# Patient Record
Sex: Female | Born: 1960 | Race: Black or African American | Hispanic: No | Marital: Married | Smoking: Never smoker
Health system: Southern US, Community
[De-identification: ages and names within clinical notes are randomized; demographics above are authoritative.]

## PROBLEM LIST (undated history)

## (undated) DIAGNOSIS — Z862 Personal history of diseases of the blood and blood-forming organs and certain disorders involving the immune mechanism: Secondary | ICD-10-CM

## (undated) DIAGNOSIS — E669 Obesity, unspecified: Secondary | ICD-10-CM

## (undated) DIAGNOSIS — G4730 Sleep apnea, unspecified: Secondary | ICD-10-CM

## (undated) HISTORY — DX: Obesity, unspecified: E66.9

## (undated) HISTORY — DX: Personal history of diseases of the blood and blood-forming organs and certain disorders involving the immune mechanism: Z86.2

## (undated) HISTORY — DX: Sleep apnea, unspecified: G47.30

---

## 1998-06-02 ENCOUNTER — Other Ambulatory Visit: Admission: RE | Admit: 1998-06-02 | Discharge: 1998-06-02 | Payer: Self-pay | Admitting: Obstetrics

## 1999-06-04 ENCOUNTER — Other Ambulatory Visit: Admission: RE | Admit: 1999-06-04 | Discharge: 1999-06-04 | Payer: Self-pay | Admitting: Obstetrics

## 1999-07-24 ENCOUNTER — Encounter: Admission: RE | Admit: 1999-07-24 | Discharge: 1999-07-24 | Payer: Self-pay | Admitting: Obstetrics

## 1999-07-24 ENCOUNTER — Encounter: Payer: Self-pay | Admitting: Obstetrics

## 2002-07-18 ENCOUNTER — Encounter: Payer: Self-pay | Admitting: Obstetrics

## 2002-07-18 ENCOUNTER — Ambulatory Visit (HOSPITAL_COMMUNITY): Admission: RE | Admit: 2002-07-18 | Discharge: 2002-07-18 | Payer: Self-pay | Admitting: Obstetrics

## 2003-10-02 ENCOUNTER — Ambulatory Visit (HOSPITAL_COMMUNITY): Admission: RE | Admit: 2003-10-02 | Discharge: 2003-10-02 | Payer: Self-pay | Admitting: Obstetrics

## 2005-11-16 ENCOUNTER — Ambulatory Visit (HOSPITAL_COMMUNITY): Admission: RE | Admit: 2005-11-16 | Discharge: 2005-11-16 | Payer: Self-pay | Admitting: Obstetrics

## 2007-02-17 ENCOUNTER — Other Ambulatory Visit: Admission: RE | Admit: 2007-02-17 | Discharge: 2007-02-17 | Payer: Self-pay | Admitting: Family Medicine

## 2007-02-23 ENCOUNTER — Encounter: Admission: RE | Admit: 2007-02-23 | Discharge: 2007-02-23 | Payer: Self-pay | Admitting: Family Medicine

## 2008-02-20 ENCOUNTER — Other Ambulatory Visit: Admission: RE | Admit: 2008-02-20 | Discharge: 2008-02-20 | Payer: Self-pay | Admitting: Physician Assistant

## 2008-02-23 ENCOUNTER — Encounter: Admission: RE | Admit: 2008-02-23 | Discharge: 2008-02-23 | Payer: Self-pay | Admitting: Family Medicine

## 2010-02-22 ENCOUNTER — Encounter: Payer: Self-pay | Admitting: Family Medicine

## 2010-03-27 ENCOUNTER — Other Ambulatory Visit: Payer: Self-pay | Admitting: Family Medicine

## 2010-03-27 DIAGNOSIS — Z1231 Encounter for screening mammogram for malignant neoplasm of breast: Secondary | ICD-10-CM

## 2010-04-07 ENCOUNTER — Ambulatory Visit: Payer: Self-pay

## 2010-04-07 ENCOUNTER — Ambulatory Visit
Admission: RE | Admit: 2010-04-07 | Discharge: 2010-04-07 | Disposition: A | Discharge status: Home / Self Care | Payer: BC Managed Care – PPO | Source: Ambulatory Visit | Attending: Family Medicine | Admitting: Family Medicine

## 2010-04-07 DIAGNOSIS — Z1231 Encounter for screening mammogram for malignant neoplasm of breast: Secondary | ICD-10-CM

## 2011-02-02 HISTORY — PX: OTHER SURGICAL HISTORY: SHX169

## 2011-03-08 ENCOUNTER — Other Ambulatory Visit: Payer: Self-pay | Admitting: Family Medicine

## 2011-03-08 DIAGNOSIS — Z1231 Encounter for screening mammogram for malignant neoplasm of breast: Secondary | ICD-10-CM

## 2011-04-12 ENCOUNTER — Ambulatory Visit
Admission: RE | Admit: 2011-04-12 | Discharge: 2011-04-12 | Disposition: A | Discharge status: Home / Self Care | Payer: BC Managed Care – PPO | Source: Ambulatory Visit | Attending: Family Medicine | Admitting: Family Medicine

## 2011-04-12 DIAGNOSIS — Z1231 Encounter for screening mammogram for malignant neoplasm of breast: Secondary | ICD-10-CM

## 2012-05-05 ENCOUNTER — Other Ambulatory Visit: Payer: Self-pay

## 2012-05-05 ENCOUNTER — Other Ambulatory Visit: Payer: Self-pay | Admitting: Family Medicine

## 2012-05-05 DIAGNOSIS — Z1231 Encounter for screening mammogram for malignant neoplasm of breast: Secondary | ICD-10-CM

## 2012-06-01 ENCOUNTER — Ambulatory Visit: Payer: BC Managed Care – PPO

## 2012-06-02 ENCOUNTER — Ambulatory Visit
Admission: RE | Admit: 2012-06-02 | Discharge: 2012-06-02 | Disposition: A | Discharge status: Home / Self Care | Payer: PRIVATE HEALTH INSURANCE | Source: Ambulatory Visit

## 2012-06-02 DIAGNOSIS — Z1231 Encounter for screening mammogram for malignant neoplasm of breast: Secondary | ICD-10-CM

## 2012-06-14 ENCOUNTER — Other Ambulatory Visit (HOSPITAL_COMMUNITY)
Admission: RE | Admit: 2012-06-14 | Discharge: 2012-06-14 | Disposition: A | Discharge status: Home / Self Care | Payer: PRIVATE HEALTH INSURANCE | Source: Ambulatory Visit | Attending: Family Medicine | Admitting: Family Medicine

## 2012-06-14 ENCOUNTER — Other Ambulatory Visit: Payer: Self-pay | Admitting: Family Medicine

## 2012-06-14 DIAGNOSIS — Z1151 Encounter for screening for human papillomavirus (HPV): Secondary | ICD-10-CM | POA: Insufficient documentation

## 2012-06-14 DIAGNOSIS — Z124 Encounter for screening for malignant neoplasm of cervix: Secondary | ICD-10-CM | POA: Insufficient documentation

## 2013-01-05 ENCOUNTER — Encounter: Payer: Self-pay | Admitting: General Surgery

## 2013-01-05 DIAGNOSIS — E669 Obesity, unspecified: Secondary | ICD-10-CM

## 2013-01-05 DIAGNOSIS — G4733 Obstructive sleep apnea (adult) (pediatric): Secondary | ICD-10-CM | POA: Insufficient documentation

## 2013-01-08 ENCOUNTER — Ambulatory Visit: Payer: PRIVATE HEALTH INSURANCE | Admitting: Cardiology

## 2013-02-20 ENCOUNTER — Emergency Department (HOSPITAL_COMMUNITY)
Admission: EM | Admit: 2013-02-20 | Discharge: 2013-02-20 | Disposition: A | Discharge status: Home / Self Care | Payer: PRIVATE HEALTH INSURANCE | Attending: Emergency Medicine | Admitting: Emergency Medicine

## 2013-02-20 ENCOUNTER — Emergency Department (HOSPITAL_COMMUNITY): Payer: PRIVATE HEALTH INSURANCE

## 2013-02-20 ENCOUNTER — Encounter (HOSPITAL_COMMUNITY): Payer: Self-pay | Admitting: Emergency Medicine

## 2013-02-20 DIAGNOSIS — IMO0002 Reserved for concepts with insufficient information to code with codable children: Secondary | ICD-10-CM | POA: Insufficient documentation

## 2013-02-20 DIAGNOSIS — Z862 Personal history of diseases of the blood and blood-forming organs and certain disorders involving the immune mechanism: Secondary | ICD-10-CM | POA: Insufficient documentation

## 2013-02-20 DIAGNOSIS — S0990XA Unspecified injury of head, initial encounter: Secondary | ICD-10-CM | POA: Insufficient documentation

## 2013-02-20 DIAGNOSIS — Y9389 Activity, other specified: Secondary | ICD-10-CM | POA: Insufficient documentation

## 2013-02-20 DIAGNOSIS — S0993XA Unspecified injury of face, initial encounter: Secondary | ICD-10-CM | POA: Insufficient documentation

## 2013-02-20 DIAGNOSIS — S8990XA Unspecified injury of unspecified lower leg, initial encounter: Secondary | ICD-10-CM | POA: Insufficient documentation

## 2013-02-20 DIAGNOSIS — S60221A Contusion of right hand, initial encounter: Secondary | ICD-10-CM

## 2013-02-20 DIAGNOSIS — G4730 Sleep apnea, unspecified: Secondary | ICD-10-CM | POA: Insufficient documentation

## 2013-02-20 DIAGNOSIS — Y92410 Unspecified street and highway as the place of occurrence of the external cause: Secondary | ICD-10-CM | POA: Insufficient documentation

## 2013-02-20 DIAGNOSIS — S99929A Unspecified injury of unspecified foot, initial encounter: Secondary | ICD-10-CM

## 2013-02-20 DIAGNOSIS — S0590XA Unspecified injury of unspecified eye and orbit, initial encounter: Secondary | ICD-10-CM | POA: Insufficient documentation

## 2013-02-20 DIAGNOSIS — E669 Obesity, unspecified: Secondary | ICD-10-CM | POA: Insufficient documentation

## 2013-02-20 DIAGNOSIS — S60229A Contusion of unspecified hand, initial encounter: Secondary | ICD-10-CM | POA: Insufficient documentation

## 2013-02-20 DIAGNOSIS — S99919A Unspecified injury of unspecified ankle, initial encounter: Secondary | ICD-10-CM | POA: Insufficient documentation

## 2013-02-20 DIAGNOSIS — S199XXA Unspecified injury of neck, initial encounter: Secondary | ICD-10-CM

## 2013-02-20 DIAGNOSIS — Z79899 Other long term (current) drug therapy: Secondary | ICD-10-CM | POA: Insufficient documentation

## 2013-02-20 MED ORDER — CYCLOBENZAPRINE HCL 10 MG PO TABS: 10.0000 mg | ORAL_TABLET | Freq: Three times a day (TID) | ORAL | Status: DC | PRN

## 2013-02-20 MED ORDER — TETRACAINE HCL 0.5 % OP SOLN
2.0000 [drp] | Freq: Once | OPHTHALMIC | Status: AC
  Administered 2013-02-20: 2 [drp] via OPHTHALMIC
  Filled 2013-02-20: qty 2

## 2013-02-20 MED ORDER — FLUORESCEIN SODIUM 1 MG OP STRP
1.0000 | ORAL_STRIP | Freq: Once | OPHTHALMIC | Status: AC
  Administered 2013-02-20: 1 via OPHTHALMIC
  Filled 2013-02-20: qty 1

## 2013-02-20 MED ORDER — IBUPROFEN 800 MG PO TABS: 800.0000 mg | ORAL_TABLET | Freq: Three times a day (TID) | ORAL | Status: DC | PRN
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
## 2013-02-21 NOTE — ED Provider Notes (Signed)
Medical screening examination/treatment/procedure(s) were performed by non-physician practitioner and as supervising physician I was immediately available for consultation/collaboration.  Megan E Docherty, MD 02/21/13 1200 

## 2013-02-23 ENCOUNTER — Ambulatory Visit
Admission: RE | Admit: 2013-02-23 | Discharge: 2013-02-23 | Disposition: A | Discharge status: Home / Self Care | Payer: PRIVATE HEALTH INSURANCE | Source: Ambulatory Visit | Attending: Family Medicine | Admitting: Family Medicine

## 2013-02-23 ENCOUNTER — Other Ambulatory Visit: Payer: Self-pay | Admitting: Family Medicine

## 2013-08-10 ENCOUNTER — Encounter: Payer: Self-pay | Admitting: *Deleted

## 2013-10-31 ENCOUNTER — Other Ambulatory Visit: Payer: Self-pay

## 2013-10-31 DIAGNOSIS — Z1231 Encounter for screening mammogram for malignant neoplasm of breast: Secondary | ICD-10-CM

## 2013-11-19 ENCOUNTER — Ambulatory Visit
Admission: RE | Admit: 2013-11-19 | Discharge: 2013-11-19 | Disposition: A | Discharge status: Home / Self Care | Payer: PRIVATE HEALTH INSURANCE | Source: Ambulatory Visit

## 2013-11-19 DIAGNOSIS — Z1231 Encounter for screening mammogram for malignant neoplasm of breast: Secondary | ICD-10-CM
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
## 2015-11-18 ENCOUNTER — Other Ambulatory Visit: Payer: Self-pay | Admitting: Family Medicine

## 2015-11-18 DIAGNOSIS — Z1231 Encounter for screening mammogram for malignant neoplasm of breast: Secondary | ICD-10-CM

## 2015-11-26 ENCOUNTER — Ambulatory Visit
Admission: RE | Admit: 2015-11-26 | Discharge: 2015-11-26 | Disposition: A | Discharge status: Home / Self Care | Payer: PRIVATE HEALTH INSURANCE | Source: Ambulatory Visit | Attending: Family Medicine | Admitting: Family Medicine

## 2015-11-26 DIAGNOSIS — Z1231 Encounter for screening mammogram for malignant neoplasm of breast: Secondary | ICD-10-CM

## 2016-08-19 ENCOUNTER — Other Ambulatory Visit (HOSPITAL_COMMUNITY)
Admission: RE | Admit: 2016-08-19 | Discharge: 2016-08-19 | Disposition: A | Discharge status: Home / Self Care | Payer: Self-pay | Source: Ambulatory Visit | Attending: Family Medicine | Admitting: Family Medicine

## 2016-08-19 ENCOUNTER — Other Ambulatory Visit: Payer: Self-pay | Admitting: Family Medicine

## 2016-08-19 DIAGNOSIS — Z124 Encounter for screening for malignant neoplasm of cervix: Secondary | ICD-10-CM | POA: Insufficient documentation

## 2016-08-23 LAB — CYTOLOGY - PAP
Diagnosis: NEGATIVE
HPV: NOT DETECTED

## 2017-09-28 DIAGNOSIS — Z1211 Encounter for screening for malignant neoplasm of colon: Secondary | ICD-10-CM | POA: Diagnosis not present

## 2017-09-28 DIAGNOSIS — G4733 Obstructive sleep apnea (adult) (pediatric): Secondary | ICD-10-CM | POA: Diagnosis not present

## 2017-09-28 DIAGNOSIS — Z0000 Encounter for general adult medical examination without abnormal findings: Secondary | ICD-10-CM | POA: Diagnosis not present

## 2017-09-28 DIAGNOSIS — R7303 Prediabetes: Secondary | ICD-10-CM | POA: Diagnosis not present

## 2017-11-07 ENCOUNTER — Encounter: Payer: Self-pay | Admitting: Registered"

## 2017-11-07 ENCOUNTER — Encounter: Payer: BLUE CROSS/BLUE SHIELD | Attending: Family Medicine | Admitting: Registered"

## 2017-11-07 DIAGNOSIS — Z713 Dietary counseling and surveillance: Secondary | ICD-10-CM | POA: Diagnosis not present

## 2017-11-07 DIAGNOSIS — R7303 Prediabetes: Secondary | ICD-10-CM | POA: Diagnosis not present

## 2017-11-07 NOTE — Progress Notes (Signed)
  Medical Nutrition Therapy:  Appt start time: 2:12 end time:  3:12.   Assessment:  Primary concerns today: Pt states she has prediabetes. Pt states she was told her A1c has increased since last year. Per referral, recent A1c is 6.3.   Pt expectations: wants to know what to eat, needs guidelines on what to eat to manage prediabetes and cholesterol  Pt states she wants to be <200 lbs by Aug 2020. Pt states she does not want to take medicine. Pt states parents did not have diabetes but her sister and niece have it. Pt states she is very confident and comfortable in her own skin.   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 Pt states she wants easy food options because she is busy. Pt states she averages about 7 hours of sleep a night; typically wakes up at 8pm, starts working at 10am, and eats breakfast most days before work.   Pt states she tries to do portion control; does not feel like she is restricting. Pt states her trainer thinks she is not eating enough.  Pt states she recently bought elliptical and exercise bike.     Preferred Learning Style:   No preference indicated   Learning Readiness:   Ready  Change in progress   MEDICATIONS: See list   DIETARY INTAKE:  Usual eating pattern includes 3 meals and 1 snacks per day.  Everyday foods include salad, grilled protein, vegetables, cereal, eggs, bacon, and greek yogurt.  Avoided foods include beets.    24-hr recall:  B ( AM): 1 c raisin bran or 2 slices of [place] bacon + scrambled eggs with cheese or blueberries + greek yogurt  Snk ( AM): none  L ( PM): salad (spinach, kale, cucumber, cheese, peppers) + chicken + vinaigrette dressing + crackers or grilled chicken Snk ( PM): none D ( PM): grilled salmon/shrimp/chicken or spaghetti + asparagus/spinach/green beans/broccoli or grilled chicken + popcorn Snk ( PM): sometimes popcorn or fruit or cheese Beverages: water, whole milk, almond milk, sweet tea  (occasionally), orange juice (2 glasses/week)  Usual physical activity: walking 2.2 miles 4-5 days/week + free weights + 100 wall pushups  Estimated energy needs: 1600 calories 180 g carbohydrates 120 g protein 44 g fat  Progress Towards Goal(s):  In progress.   Nutritional Diagnosis:  NB-1.1 Food and nutrition-related knowledge deficit As related to prediabetes.  As evidenced by pt reports she has not had previous education.    Intervention:  Nutrition education and counseling. Pt was educated and counseled on prediabetes, fiber, ways to increase fiber intake, work-life balance, My Plate, eating balanced meals/snacks, and eating to nourish her body. Pt was in agreement with goals listed.  Goals: - Add water aerobics to physical activity - Aim for at least 30 minutes of physical activity, 5 days/week - Aim to increase fiber intake with non-starchy vegetable intake with meals - Combine carbohydrates with proteins for snack options - Aim to eat breakfast within 2-3 hours of waking up  Teaching Method Utilized:  Visual Auditory Hands on  Handouts given during visit include:  My Plate for Diabetes  Barriers to learning/adherence to lifestyle change: none identified  Demonstrated degree of understanding via:  Teach Back   Monitoring/Evaluation:  Dietary intake, exercise, and body weight in 3 month(s).

## 2017-11-07 NOTE — Patient Instructions (Addendum)
-   Add water aerobics to physical activity  - Aim for at least 30 minutes of physical activity, 5 days/week  - Aim to increase fiber intake with non-starchy vegetable intake with meals  - Combine carbohydrates with proteins for snack options  - Aim to eat breakfast within 2-3 hours of waking up

## 2017-12-06 ENCOUNTER — Other Ambulatory Visit: Payer: Self-pay | Admitting: Family Medicine

## 2017-12-06 DIAGNOSIS — Z1231 Encounter for screening mammogram for malignant neoplasm of breast: Secondary | ICD-10-CM

## 2017-12-07 ENCOUNTER — Ambulatory Visit: Payer: BLUE CROSS/BLUE SHIELD

## 2018-01-17 ENCOUNTER — Ambulatory Visit
Admission: RE | Admit: 2018-01-17 | Discharge: 2018-01-17 | Disposition: A | Discharge status: Home / Self Care | Payer: BLUE CROSS/BLUE SHIELD | Source: Ambulatory Visit | Attending: Family Medicine | Admitting: Family Medicine

## 2018-01-17 DIAGNOSIS — Z1231 Encounter for screening mammogram for malignant neoplasm of breast: Secondary | ICD-10-CM | POA: Diagnosis not present

## 2018-02-07 ENCOUNTER — Ambulatory Visit: Payer: BLUE CROSS/BLUE SHIELD | Admitting: Registered"

## 2018-02-09 ENCOUNTER — Ambulatory Visit: Payer: BLUE CROSS/BLUE SHIELD | Admitting: Cardiology

## 2018-02-09 DIAGNOSIS — R7303 Prediabetes: Secondary | ICD-10-CM | POA: Diagnosis not present

## 2018-02-09 DIAGNOSIS — E7800 Pure hypercholesterolemia, unspecified: Secondary | ICD-10-CM | POA: Diagnosis not present

## 2018-02-20 ENCOUNTER — Ambulatory Visit: Payer: BLUE CROSS/BLUE SHIELD | Admitting: Cardiology

## 2018-02-24 ENCOUNTER — Ambulatory Visit: Payer: BLUE CROSS/BLUE SHIELD | Admitting: Cardiology

## 2018-02-24 ENCOUNTER — Telehealth: Payer: Self-pay | Admitting: *Deleted

## 2018-02-24 ENCOUNTER — Encounter (INDEPENDENT_AMBULATORY_CARE_PROVIDER_SITE_OTHER): Payer: Self-pay

## 2018-02-24 ENCOUNTER — Encounter: Payer: Self-pay | Admitting: Cardiology

## 2018-02-24 VITALS — BP 114/70 | HR 68 | Ht 65.0 in | Wt 243.0 lb

## 2018-02-24 DIAGNOSIS — G4733 Obstructive sleep apnea (adult) (pediatric): Secondary | ICD-10-CM | POA: Diagnosis not present

## 2018-02-24 NOTE — Telephone Encounter (Signed)
-----   Message from Dustin Flock, RN sent at 02/24/2018  3:04 PM EST ----- Regarding: Sleep Hello, Dr. Mayford Knife ordered the patient to get an appointment with [facility] and a download. She also needs to get new supplies. I did not order anything. Please order and send. Thanks, [employer]

## 2018-02-24 NOTE — Progress Notes (Signed)
Cardiology Office Note    Date:  02/24/2018   ID:  Linda Alexander, DOB 16-Dec-1960, MRN [id number]  PCP:  Sigmund Hazel, MD  Cardiologist:  Armanda Magic, MD   Chief Complaint  Patient presents with  . Sleep Apnea    History of Present Illness:  Linda Alexander is a 58 y.o. female who is being seen today for the evaluation of obstructive sleep apnea at the request of Sigmund Hazel, MD.  This is a 58 year old obese African-American female with a history of obesity, iron deficiency anemia, obstructive sleep apnea on CPAP.  She has been on CPAP for some time but has not followed up with a sleep doctor in years.  She is referred here for follow-up for sleep apnea in order for her to get more supplies from her DME.She is doing well with her CPAP device and thinks that she has gotten used to it.  She tolerates the mask and feels the pressure is adequate.  Since going on CPAP she feels rested in the am and has no significant daytime sleepiness.  Her only complaint is that she gets a dry mouth from time to time and does not know how to set her humidity dial on her device..  She does not think that she snores.       Past Medical History:  Diagnosis Date  . History of iron deficiency anemia   . LGA (large for gestational age) infant    Screen FBS yearly  . Obesity   . Sleep apnea    With CPAP -Dr Mayford Knife    Past Surgical History:  Procedure Laterality Date  . CESAREAN SECTION     x2  . colonoscopy  1/13   hem in 5 years, colon in 10 years    Current Medications: No outpatient medications have been marked as taking for the 02/24/18 encounter (Office Visit) with Quintella Reichert, MD.    Allergies:   Patient has no known allergies.   Social History   Socioeconomic History  . Marital status: Married    Spouse name: Not on file  . Number of children: Not on file  . Years of education: Not on file  . Highest education level: Not on file  Occupational History  . Not on  file  Social Needs  . Financial resource strain: Not on file  . Food insecurity:    Worry: Not on file    Inability: Not on file  . Transportation needs:    Medical: Not on file    Non-medical: Not on file  Tobacco Use  . Smoking status: Never Smoker  . Smokeless tobacco: Never Used  Substance and Sexual Activity  . Alcohol use: No  . Drug use: No  . Sexual activity: Not on file  Lifestyle  . Physical activity:    Days per week: Not on file    Minutes per session: Not on file  . Stress: Not on file  Relationships  . Social connections:    Talks on phone: Not on file    Gets together: Not on file    Attends religious service: Not on file    Active member of club or organization: Not on file    Attends meetings of clubs or organizations: Not on file    Relationship status: Not on file  Other Topics Concern  . Not on file  Social History Narrative  . Not on file     Family History:  The patient's family history includes  Anemia in her sister; Arthritis in her mother; Asthma in her sister; Congestive Heart Failure in her father and mother; Dementia in her mother; Diabetes in her maternal grandfather and sister; Diverticulitis in her father; Gout in her mother; Heart Problems in her mother; Hypertension in her brother, maternal grandmother, and mother; Kidney disease in her paternal grandfather; Thyroid disease in her mother and sister.   ROS:   Please see the history of present illness.    ROS All other systems reviewed and are negative.  No flowsheet data found.     PHYSICAL EXAM:   VS:  BP 114/70   Pulse 68   Ht 5\' 5"  (1.651 m)   Wt 243 lb (110.2 kg)   SpO2 98%   BMI 40.44 kg/m    GEN: Well nourished, well developed, in no acute distress  HEENT: normal  Neck: no JVD, carotid bruits, or masses Cardiac: RRR; no murmurs, rubs, or gallops,no edema.  Intact distal pulses bilaterally.  Respiratory:  clear to auscultation bilaterally, normal work of breathing GI:  soft, nontender, nondistended, + BS MS: no deformity or atrophy  Skin: warm and dry, no rash Neuro:  Alert and Oriented x 3, Strength and sensation are intact Psych: euthymic mood, full affect  Wt Readings from Last 3 Encounters:  02/24/18 243 lb (110.2 kg)  01/05/13 235 lb 9.6 oz (106.9 kg)      Studies/Labs Reviewed:   EKG:  EKG is not ordered today.    Recent Labs: No results found for requested labs within last 8760 hours.   Lipid Panel No results found for: CHOL, TRIG, HDL, CHOLHDL, VLDL, LDLCALC, LDLDIRECT  Additional studies/ records that were reviewed today include:  Office notes from [facility]    ASSESSMENT:    1. Obstructive sleep apnea (adult) (pediatric)   2. Morbid obesity (HCC)      PLAN:  In order of problems listed above:  1. OSA - she has a history of significant obstructive sleep apnea has been on CPAP for years.  She has not followed up with a sleep physician recently.  She is tolerating her CPAP and benefiting from it but she needs new supplies.  [facility].  I have also instructed her to take her device with her so that they can teach her how to use the humidity dial.  She will let me know if she develops any problems.  I will get a download from the DME.  2.  Morbid obesity - I have encouraged her to get into a routine exercise program and cut back on carbs and portions.     Medication Adjustments/Labs and Tests Ordered: Current medicines are reviewed at length with the patient today.  Concerns regarding medicines are outlined above.  Medication changes, Labs and Tests ordered today are listed in the Patient Instructions below.  There are no Patient Instructions on file for this visit.   Signed, Armanda Magic, MD  02/24/2018 2:44 PM    [facility] [address] [place], [place], [place]  [address] Phone: [phone]; Fax: [phone]

## 2018-02-24 NOTE — Patient Instructions (Signed)

## 2018-02-27 NOTE — Telephone Encounter (Signed)
Upon patient request DME selection is [facility]. Patient understands she will be contacted by [facility]. Patient understands to call if [facility] does not contact her in a timely manner. [facility] notified of new order  Please add to airview Patient was grateful for the call and thanked me.  I will get a download after patient gets her supplies.

## 2018-03-07 ENCOUNTER — Ambulatory Visit: Payer: BLUE CROSS/BLUE SHIELD | Admitting: Registered"

## 2018-03-16 ENCOUNTER — Encounter: Payer: Self-pay | Admitting: Registered"

## 2018-03-16 ENCOUNTER — Encounter: Payer: BLUE CROSS/BLUE SHIELD | Attending: Family Medicine | Admitting: Registered"

## 2018-03-16 DIAGNOSIS — R7303 Prediabetes: Secondary | ICD-10-CM | POA: Diagnosis not present

## 2018-03-16 NOTE — Progress Notes (Signed)
  Medical Nutrition Therapy:  Appt start time: 12:08 end time:  12:25.   Assessment:  Primary concerns today: Pt arrives today stating  A1c has decreased from 6.3 to 6.1, but cholesterol is 226. Pt states she has lost 9 lbs since previous visit.   Pt expectations: wants to know what to eat, needs guidelines on what to eat to manage prediabetes and cholesterol  Pt states she has been eating grilled shrimp about 3x/week. Has eliminated eating bread, reduced juice intake, reduced cheese intake all with efforts to maintain normal blood sugar and cholesterol values. Pt reports next doctor visit is May 2020.   Previous visit: Pt states she wants to be <200 lbs by Aug 2020. Pt states she does not want to take medicine. Pt states parents did not have diabetes but her sister and niece have it. Pt states she is very confident and comfortable in her own skin.   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 Pt states she wants easy food options because she is busy. Pt states she averages about 7 hours of sleep a night; typically wakes up at 8pm, starts working at 10am, and eats breakfast most days before work.   Pt states she tries to do portion control; does not feel like she is restricting. Pt states her trainer thinks she is not eating enough.    Preferred Learning Style:   No preference indicated   Learning Readiness:   Ready  Change in progress   MEDICATIONS: See list   DIETARY INTAKE:  Usual eating pattern includes 3 meals and 1 snacks per day.  Everyday foods include salad, grilled protein, vegetables, cereal, eggs, bacon, and greek yogurt.  Avoided foods include beets.    24-hr recall:  B ( AM): 1 c raisin bran or 2 slices of [place] bacon + scrambled eggs with cheese or berries + greek yogurt  Snk ( AM): none  L ( PM): salad (spinach, kale, cucumber, cheese, peppers) + chicken + vinaigrette dressing + crackers or grilled chicken Snk ( PM): none D ( PM):   asparagus/spinach/green beans/broccoli + grilled chicken  Snk ( PM): sometimes popcorn or fruit or cheese Beverages: water, whole milk, almond milk, sweet tea (occasionally), orange juice (2 glasses/week)  Usual physical activity: elliptical 20 min, stationary bike 4-5 days/week + free weights + 100 wall pushups  Estimated energy needs: 1600 calories 180 g carbohydrates 120 g protein 44 g fat  Progress Towards Goal(s):  In progress.   Nutritional Diagnosis:  NB-1.1 Food and nutrition-related knowledge deficit As related to prediabetes.  As evidenced by pt reports she has not had previous education.    Intervention:  Nutrition education and counseling. Pt was educated and counseled on ways to continue to work on reducing cholesterol intake. Pt was encouraged on great work with reducing A1c value.   Teaching Method Utilized:  Visual Auditory Hands on  Handouts given during visit include:  Cholesterol-lowering nutrition therapy  Barriers to learning/adherence to lifestyle change: none identified  Demonstrated degree of understanding via:  Teach Back   Monitoring/Evaluation:  Dietary intake, exercise, and body weight prn. Pt states she will contact RD after 06/2018 visit with PCP and update with lab values.

## 2018-04-05 ENCOUNTER — Telehealth: Payer: Self-pay | Admitting: *Deleted

## 2018-04-05 NOTE — Telephone Encounter (Signed)
-----   Message from Quintella Reichert, MD sent at 04/04/2018  3:07 PM EST ----- AHI too high - please verify what mask she uses and if she uses a chin strap.  Also document how old her mask is and if she sleeps on her back any

## 2018-04-05 NOTE — Telephone Encounter (Addendum)
Patient says she has a resmed Mirage Quattro medium size FF mask. Resmed airsense 10. She does not have a chin strap. Her mask is 70 months old. She does not sleep on her back but on her side.

## 2018-04-21 DIAGNOSIS — G4733 Obstructive sleep apnea (adult) (pediatric): Secondary | ICD-10-CM | POA: Diagnosis not present

## 2018-05-02 ENCOUNTER — Encounter

## 2018-05-02 ENCOUNTER — Ambulatory Visit: Payer: BLUE CROSS/BLUE SHIELD | Admitting: Cardiology

## 2018-05-29 DIAGNOSIS — G4733 Obstructive sleep apnea (adult) (pediatric): Secondary | ICD-10-CM | POA: Diagnosis not present

## 2018-06-05 ENCOUNTER — Telehealth: Payer: Self-pay | Admitting: *Deleted

## 2018-06-05 NOTE — Telephone Encounter (Signed)
Informed patient of compliance results and verbalized understanding was indicated. Patient is aware and agreeable to AHI being within range at 4.4. Patient is aware and agreeable to improving in compliance with machine usage.  

## 2018-06-05 NOTE — Telephone Encounter (Signed)
-----   Message from Quintella Reichert, MD sent at 06/02/2018  3:19 PM EDT ----- Good AHI on PAP but needs to improve compliance

## 2018-08-28 DIAGNOSIS — G4733 Obstructive sleep apnea (adult) (pediatric): Secondary | ICD-10-CM | POA: Diagnosis not present

## 2018-10-05 DIAGNOSIS — Z23 Encounter for immunization: Secondary | ICD-10-CM | POA: Diagnosis not present

## 2018-10-05 DIAGNOSIS — E7800 Pure hypercholesterolemia, unspecified: Secondary | ICD-10-CM | POA: Diagnosis not present

## 2018-10-05 DIAGNOSIS — Z1211 Encounter for screening for malignant neoplasm of colon: Secondary | ICD-10-CM | POA: Diagnosis not present

## 2018-10-05 DIAGNOSIS — Z0000 Encounter for general adult medical examination without abnormal findings: Secondary | ICD-10-CM | POA: Diagnosis not present

## 2018-10-05 DIAGNOSIS — R7303 Prediabetes: Secondary | ICD-10-CM | POA: Diagnosis not present

## 2018-11-29 DIAGNOSIS — G4733 Obstructive sleep apnea (adult) (pediatric): Secondary | ICD-10-CM | POA: Diagnosis not present

## 2018-12-07 ENCOUNTER — Telehealth: Payer: Self-pay | Admitting: Cardiology

## 2018-12-07 DIAGNOSIS — G4733 Obstructive sleep apnea (adult) (pediatric): Secondary | ICD-10-CM

## 2018-12-07 NOTE — Telephone Encounter (Signed)
What settings is she on

## 2018-12-07 NOTE — Telephone Encounter (Signed)
New Message:  The patient called and states that her CPAP machine has started to act up. She wanted to know if Dr. Radford Pax would put in an order for her to get a new CPAP machine. At the time of her sleep study, the machine the patient was using the patient paid for out of pocket. The patient has insurance coverage, and would like to use her insurance to get a new machine.  Please follow-up with patient

## 2018-12-11 NOTE — Telephone Encounter (Signed)
She is on a set pressure of 16 cm H20.

## 2018-12-11 NOTE — Telephone Encounter (Signed)
Please order ResMed CPAP at 16cm H2O with heated humidity and mask of choice

## 2018-12-11 NOTE — Telephone Encounter (Signed)
Order placed to [facility] for processing.

## 2018-12-21 ENCOUNTER — Other Ambulatory Visit: Payer: Self-pay

## 2018-12-21 DIAGNOSIS — Z20822 Contact with and (suspected) exposure to covid-19: Secondary | ICD-10-CM

## 2018-12-25 LAB — NOVEL CORONAVIRUS, NAA: SARS-CoV-2, NAA: NOT DETECTED

## 2018-12-26 ENCOUNTER — Telehealth: Payer: Self-pay

## 2018-12-26 NOTE — Telephone Encounter (Signed)
Patient given negative result and verbalized understanding  

## 2019-02-10 DIAGNOSIS — Z20822 Contact with and (suspected) exposure to covid-19: Secondary | ICD-10-CM | POA: Diagnosis not present

## 2019-02-10 DIAGNOSIS — Z9189 Other specified personal risk factors, not elsewhere classified: Secondary | ICD-10-CM | POA: Diagnosis not present

## 2019-02-21 DIAGNOSIS — Z20822 Contact with and (suspected) exposure to covid-19: Secondary | ICD-10-CM | POA: Diagnosis not present

## 2019-06-06 DIAGNOSIS — G4733 Obstructive sleep apnea (adult) (pediatric): Secondary | ICD-10-CM | POA: Diagnosis not present

## 2019-07-25 DIAGNOSIS — H3561 Retinal hemorrhage, right eye: Secondary | ICD-10-CM | POA: Diagnosis not present

## 2019-07-25 DIAGNOSIS — H3562 Retinal hemorrhage, left eye: Secondary | ICD-10-CM | POA: Diagnosis not present

## 2019-10-11 DIAGNOSIS — G4733 Obstructive sleep apnea (adult) (pediatric): Secondary | ICD-10-CM | POA: Diagnosis not present

## 2019-10-11 DIAGNOSIS — R7303 Prediabetes: Secondary | ICD-10-CM | POA: Diagnosis not present

## 2019-10-11 DIAGNOSIS — Z23 Encounter for immunization: Secondary | ICD-10-CM | POA: Diagnosis not present

## 2019-10-11 DIAGNOSIS — Z0000 Encounter for general adult medical examination without abnormal findings: Secondary | ICD-10-CM | POA: Diagnosis not present

## 2019-10-11 DIAGNOSIS — E7800 Pure hypercholesterolemia, unspecified: Secondary | ICD-10-CM | POA: Diagnosis not present

## 2019-10-11 DIAGNOSIS — R350 Frequency of micturition: Secondary | ICD-10-CM | POA: Diagnosis not present

## 2019-11-29 DIAGNOSIS — H3561 Retinal hemorrhage, right eye: Secondary | ICD-10-CM | POA: Diagnosis not present

## 2019-11-29 DIAGNOSIS — H35033 Hypertensive retinopathy, bilateral: Secondary | ICD-10-CM | POA: Diagnosis not present

## 2019-11-29 DIAGNOSIS — H3562 Retinal hemorrhage, left eye: Secondary | ICD-10-CM | POA: Diagnosis not present

## 2020-01-22 DIAGNOSIS — R059 Cough, unspecified: Secondary | ICD-10-CM | POA: Diagnosis not present

## 2020-02-06 ENCOUNTER — Other Ambulatory Visit: Payer: Self-pay | Admitting: Family Medicine

## 2020-02-06 DIAGNOSIS — Z1231 Encounter for screening mammogram for malignant neoplasm of breast: Secondary | ICD-10-CM

## 2020-03-17 ENCOUNTER — Ambulatory Visit
Admission: RE | Admit: 2020-03-17 | Discharge: 2020-03-17 | Disposition: A | Discharge status: Home / Self Care | Payer: BC Managed Care – PPO | Source: Ambulatory Visit | Attending: Family Medicine | Admitting: Family Medicine

## 2020-03-17 ENCOUNTER — Other Ambulatory Visit: Payer: Self-pay

## 2020-03-17 DIAGNOSIS — Z1231 Encounter for screening mammogram for malignant neoplasm of breast: Secondary | ICD-10-CM

## 2021-06-08 ENCOUNTER — Other Ambulatory Visit: Payer: Self-pay | Admitting: Family Medicine

## 2021-06-08 DIAGNOSIS — Z1231 Encounter for screening mammogram for malignant neoplasm of breast: Secondary | ICD-10-CM

## 2021-06-11 ENCOUNTER — Ambulatory Visit
Admission: RE | Admit: 2021-06-11 | Discharge: 2021-06-11 | Disposition: A | Discharge status: Home / Self Care | Payer: PRIVATE HEALTH INSURANCE | Source: Ambulatory Visit | Attending: Family Medicine | Admitting: Family Medicine

## 2021-06-11 DIAGNOSIS — Z1231 Encounter for screening mammogram for malignant neoplasm of breast: Secondary | ICD-10-CM

## 2021-06-12 ENCOUNTER — Ambulatory Visit: Payer: BC Managed Care – PPO

## 2022-05-11 ENCOUNTER — Other Ambulatory Visit: Payer: Self-pay | Admitting: Family Medicine

## 2022-05-11 DIAGNOSIS — Z1231 Encounter for screening mammogram for malignant neoplasm of breast: Secondary | ICD-10-CM

## 2022-06-17 ENCOUNTER — Inpatient Hospital Stay: Admission: RE | Admit: 2022-06-17 | Payer: PRIVATE HEALTH INSURANCE | Source: Ambulatory Visit

## 2022-07-08 ENCOUNTER — Ambulatory Visit
Admission: RE | Admit: 2022-07-08 | Discharge: 2022-07-08 | Disposition: A | Discharge status: Home / Self Care | Payer: PRIVATE HEALTH INSURANCE | Source: Ambulatory Visit | Attending: Family Medicine | Admitting: Family Medicine

## 2022-07-08 DIAGNOSIS — Z1231 Encounter for screening mammogram for malignant neoplasm of breast: Secondary | ICD-10-CM

## 2023-06-23 ENCOUNTER — Other Ambulatory Visit: Payer: Self-pay | Admitting: Family Medicine

## 2023-06-23 DIAGNOSIS — Z1231 Encounter for screening mammogram for malignant neoplasm of breast: Secondary | ICD-10-CM

## 2023-07-06 IMAGING — MG MM DIGITAL SCREENING BILAT W/ TOMO AND CAD
6 of 10 series · 6 of 30 positions shown · non-contrast
Comparison: Previous exam(s).

CLINICAL DATA: Screening.

EXAM:
DIGITAL SCREENING BILATERAL MAMMOGRAM WITH TOMOSYNTHESIS AND CAD
TECHNIQUE: Bilateral screening digital craniocaudal and mediolateral oblique
mammograms were obtained. Bilateral screening digital breast
tomosynthesis was performed. The images were evaluated with
computer-aided detection.

[L CC synth-2D]
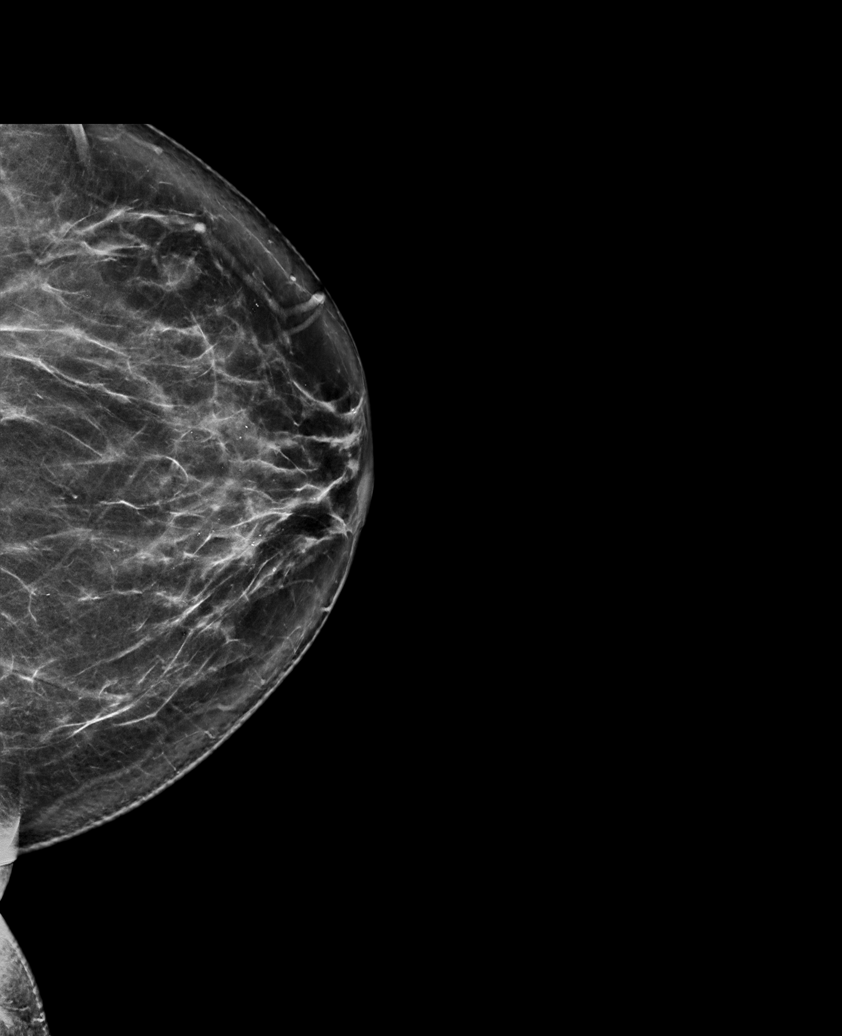

[R CC synth-2D]
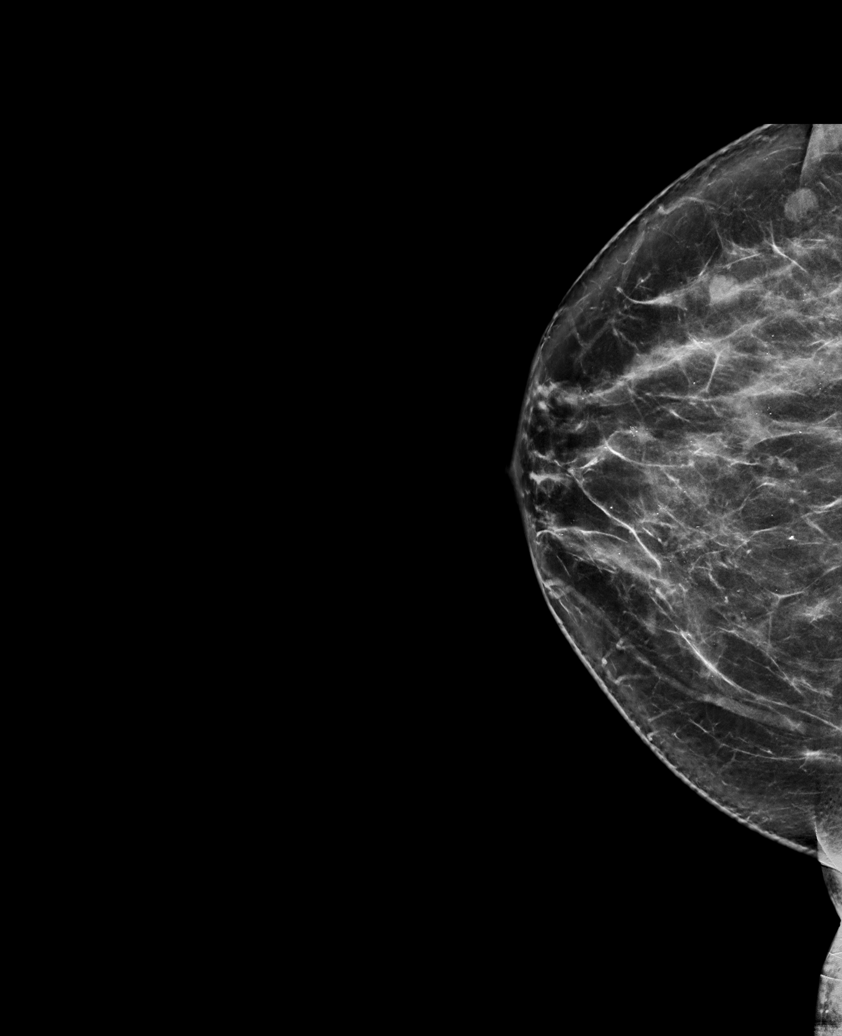

[L MLO synth-2D]
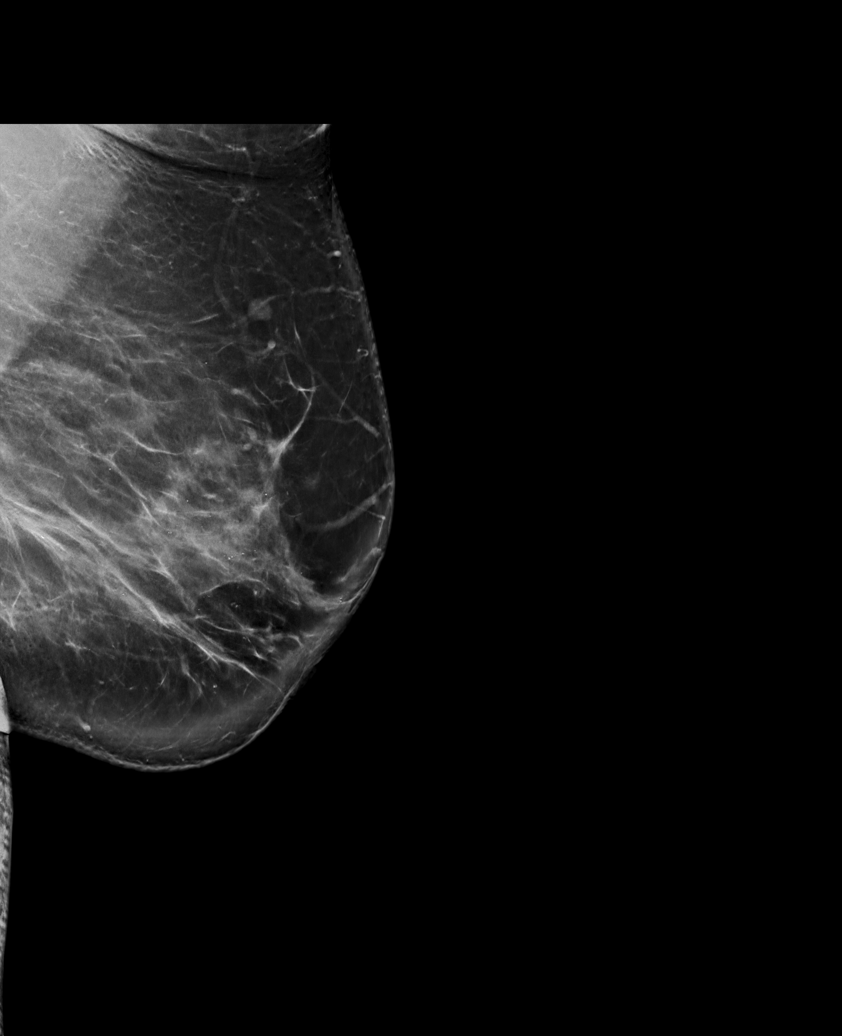

[R MLO synth-2D (1 of 2)]
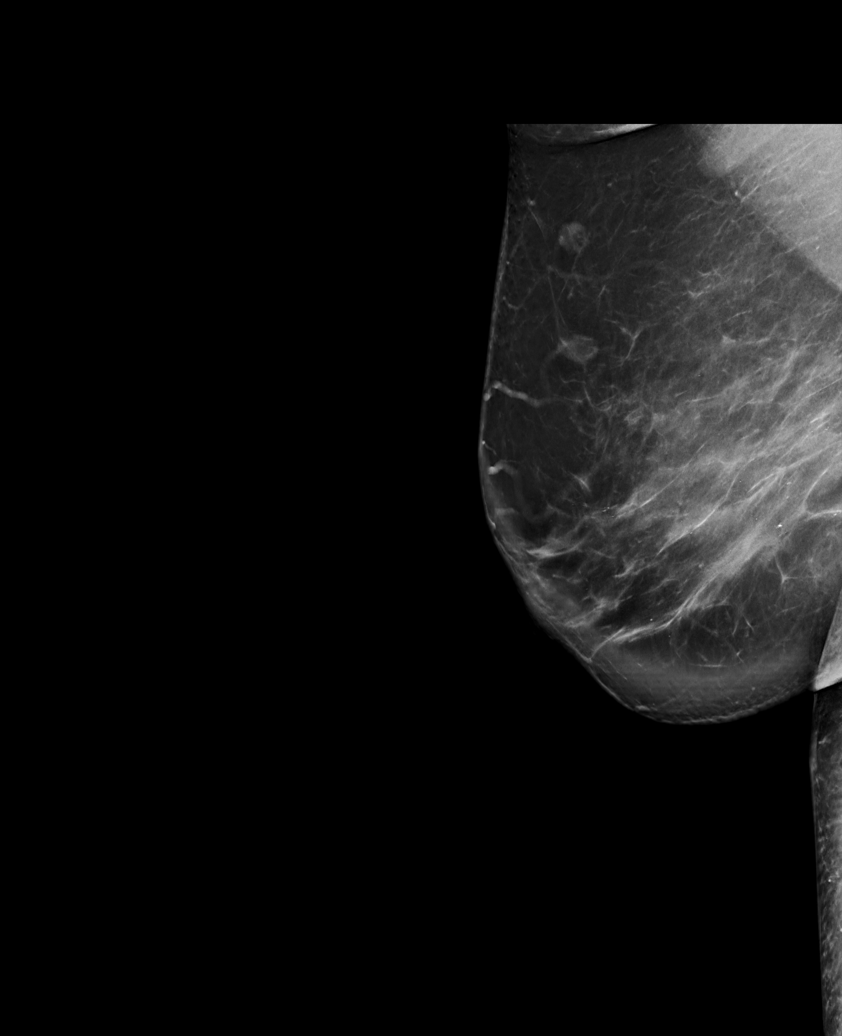

[R MLO synth-2D (2 of 2)]
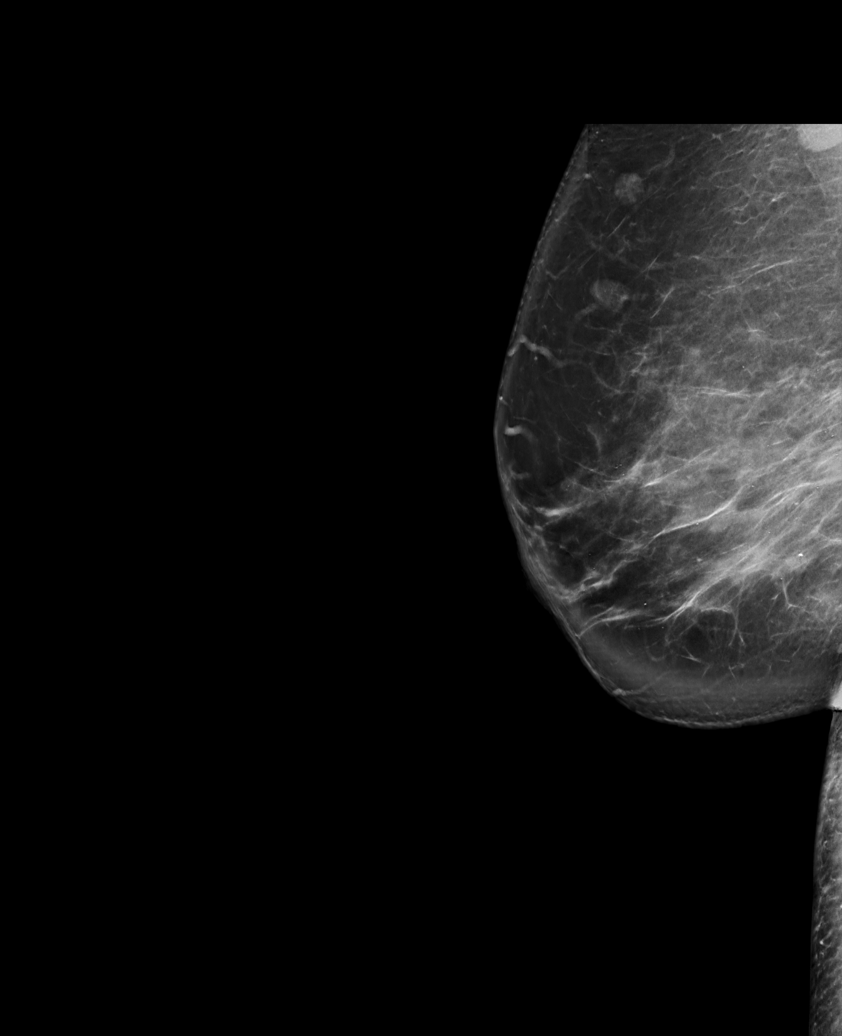

[R MLO tomo · tomo slice 61/120.0]
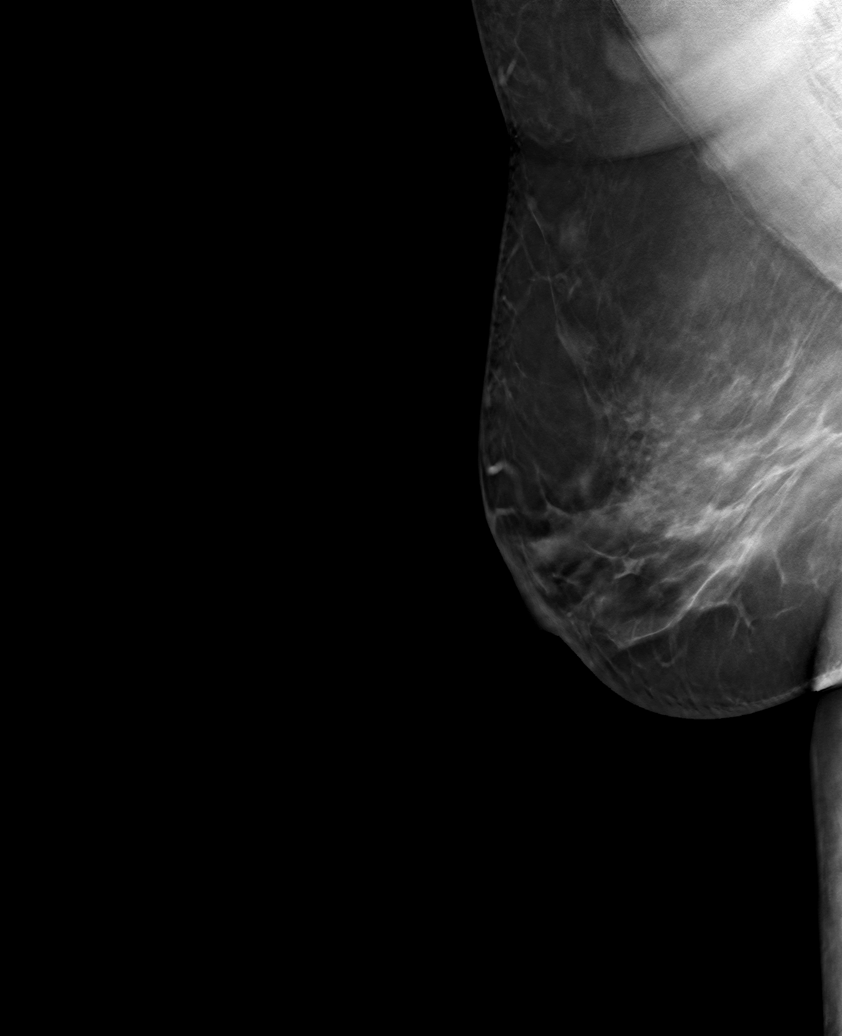

[6 of 30 positions shown; findings below may reference images not displayed]

ACR Breast Density Category c: The breast tissue is heterogeneously
dense, which may obscure small masses.
FINDINGS: There are no findings suspicious for malignancy.
IMPRESSION: No mammographic evidence of malignancy. A result letter of this
screening mammogram will be mailed directly to the patient.

RECOMMENDATION:
Screening mammogram in one year. (Code:Q3-W-BC3)

BI-RADS CATEGORY  1: Negative.

## 2023-07-20 ENCOUNTER — Ambulatory Visit
Admission: RE | Admit: 2023-07-20 | Discharge: 2023-07-20 | Disposition: A | Discharge status: Home / Self Care | Payer: PRIVATE HEALTH INSURANCE | Source: Ambulatory Visit | Attending: Family Medicine | Admitting: Family Medicine

## 2023-07-20 DIAGNOSIS — Z1231 Encounter for screening mammogram for malignant neoplasm of breast: Secondary | ICD-10-CM

## 2024-03-09 ENCOUNTER — Other Ambulatory Visit: Payer: Self-pay

## 2024-03-09 ENCOUNTER — Encounter (HOSPITAL_BASED_OUTPATIENT_CLINIC_OR_DEPARTMENT_OTHER): Payer: Self-pay | Admitting: Emergency Medicine

## 2024-03-09 ENCOUNTER — Emergency Department (HOSPITAL_BASED_OUTPATIENT_CLINIC_OR_DEPARTMENT_OTHER): Payer: PRIVATE HEALTH INSURANCE

## 2024-03-09 ENCOUNTER — Emergency Department (HOSPITAL_BASED_OUTPATIENT_CLINIC_OR_DEPARTMENT_OTHER): Payer: PRIVATE HEALTH INSURANCE | Admitting: Radiology

## 2024-03-09 ENCOUNTER — Emergency Department (HOSPITAL_BASED_OUTPATIENT_CLINIC_OR_DEPARTMENT_OTHER)
Admission: EM | Admit: 2024-03-09 | Discharge: 2024-03-09 | Disposition: A | Discharge status: Home / Self Care | Payer: PRIVATE HEALTH INSURANCE | Source: Home / Self Care | Attending: Emergency Medicine | Admitting: Emergency Medicine

## 2024-03-09 DIAGNOSIS — S3210XA Unspecified fracture of sacrum, initial encounter for closed fracture: Secondary | ICD-10-CM

## 2024-03-09 DIAGNOSIS — E049 Nontoxic goiter, unspecified: Secondary | ICD-10-CM

## 2024-03-09 DIAGNOSIS — S0990XA Unspecified injury of head, initial encounter: Secondary | ICD-10-CM

## 2024-03-09 NOTE — ED Triage Notes (Signed)
 Pt endorses mechanical fall from elliptical machine yesterday, was eval. By EMS. Denies loc. Pt c/o buttock pain, mild HA and dizziness today. Denies n/v Speech clear, pt ambulatory, denies thinners

## 2024-03-09 NOTE — ED Provider Notes (Signed)
 " [facility] Provider Note   CSN: [id number] Arrival date & time: 03/09/24  9079     Patient presents with: Linda Alexander is a 64 y.o. female.   Patient is a 64 year old who presents after mechanical fall yesterday.  She was trying to get onto an elliptical machine and the paddle slipped, causing her to fall backward onto her buttocks.  She did hit her head.  She had no loss of consciousness.  She had a mild headache.  EMS came out evaluate her and she declined transport.  This morning when she was turning over in bed she had an episode of spinning.  She turned over to her other side and had another episode of spinning.  She felt a little woozy while she was taking a shower.  For this reason she decided come and get checked out.  She does have a mild headache.  No ongoing spinning sensation.  She has a little bit of pain in her neck but mostly has pain in her tailbone area where she fell onto it yesterday.  She denies any radiation down her legs.  No numbness or weakness to her extremities.  No speech deficits or vision changes.  She is not on anticoagulants.  She denies any other injury from the fall.       Prior to Admission medications  Not on File    Allergies: Patient has no known allergies.    Review of Systems  Constitutional:  Negative for fatigue and fever.  HENT:  Negative for facial swelling and nosebleeds.   Respiratory:  Negative for shortness of breath.   Cardiovascular:  Negative for chest pain.  Gastrointestinal:  Negative for abdominal pain, nausea and vomiting.  Musculoskeletal:  Positive for back pain (Tailbone pain) and neck pain. Negative for arthralgias.  Neurological:  Positive for dizziness and headaches. Negative for syncope, facial asymmetry, speech difficulty, weakness and numbness.    Updated Vital Signs BP 120/60   Pulse 91   Temp 97.9 F (36.6 C) (Oral)   Resp 18   Wt 113.4 kg   SpO2 98%    BMI 41.60 kg/m   Physical Exam Constitutional:      Appearance: She is well-developed.  HENT:     Head: Normocephalic and atraumatic.  Eyes:     Pupils: Pupils are equal, round, and reactive to light.  Neck:     Comments: Mild pain in the mid cervical spine.  No pain in the thoracic or lumbosacral spine.  There is tenderness over the coccyx. Cardiovascular:     Rate and Rhythm: Normal rate and regular rhythm.     Heart sounds: Normal heart sounds.  Pulmonary:     Effort: Pulmonary effort is normal. No respiratory distress.     Breath sounds: Normal breath sounds. No wheezing or rales.  Chest:     Chest wall: No tenderness.  Abdominal:     General: Bowel sounds are normal.     Palpations: Abdomen is soft.     Tenderness: There is no abdominal tenderness. There is no guarding or rebound.  Musculoskeletal:        General: Normal range of motion.     Comments: No pain on palpation or range of motion of the extremities  Lymphadenopathy:     Cervical: No cervical adenopathy.  Skin:    General: Skin is warm and dry.     Findings: No rash.  Neurological:  General: No focal deficit present.     Mental Status: She is alert and oriented to person, place, and time.     Comments: Motor 5 out of 5 all extremities, sensation grossly intact to light touch all extremities     (all labs ordered are listed, but only abnormal results are displayed) Labs Reviewed - No data to display  EKG: None  Radiology: DG Sacrum/Coccyx Result Date: 03/09/2024 CLINICAL DATA:  Status post fall. EXAM: SACRUM AND COCCYX - 2+ VIEW COMPARISON:  None Available. FINDINGS: An ill-defined cortical irregularity is seen extending across the coccyx on the lateral view. IMPRESSION: Findings suspicious for a nondisplaced fracture of the coccyx. Correlate with physical examination to determine the presence of point tenderness. Subsequent CT follow-up is recommended if acute fracture remains of clinical concern.  Electronically Signed   By: Suzen Dials M.D.   On: 03/09/2024 11:12   CT Cervical Spine Wo Contrast Result Date: 03/09/2024 EXAM: CT CERVICAL SPINE WITHOUT CONTRAST 03/09/2024 10:51:03 AM TECHNIQUE: CT of the cervical spine was performed without the administration of intravenous contrast. Multiplanar reformatted images are provided for review. Automated exposure control, iterative reconstruction, and/or weight based adjustment of the mA/kV was utilized to reduce the radiation dose to as low as reasonably achievable. COMPARISON: Cervical spine radiographs 02/23/2013. CLINICAL HISTORY: Neck trauma, midline tenderness (Age 74-64y). FINDINGS: BONES AND ALIGNMENT: No acute fracture or traumatic malalignment at the cervical spine. DEGENERATIVE CHANGES: There is multilevel degenerative changes. SOFT TISSUES: No prevertebral soft tissue swelling. The left lobe of the thyroid appears enlarged. IMPRESSION: 1. No acute fracture or traumatic malalignment of the cervical spine. 2. Enlarged left thyroid lobe, recommend non-emergent thyroid ultrasound. Electronically signed by: Prentice Spade MD 03/09/2024 11:07 AM EST RP Workstation: [id number]   CT Head Wo Contrast Result Date: 03/09/2024 EXAM: CT HEAD WITHOUT CONTRAST 03/09/2024 10:51:03 AM TECHNIQUE: CT of the head was performed without the administration of intravenous contrast. Automated exposure control, iterative reconstruction, and/or weight based adjustment of the mA/kV was utilized to reduce the radiation dose to as low as reasonably achievable. COMPARISON: None available. CLINICAL HISTORY: Head trauma, moderate-severe. FINDINGS: BRAIN AND VENTRICLES: No acute hemorrhage. No evidence of acute infarct. No hydrocephalus. No extra-axial collection. No mass effect or midline shift. SINUSES: No acute abnormality. SOFT TISSUES AND SKULL: No acute soft tissue abnormality. No calvarial fracture. IMPRESSION: 1. No acute intracranial abnormality. 2. No calvarial  fracture. Electronically signed by: Prentice Spade MD 03/09/2024 10:59 AM EST RP Workstation: [id number]     Procedures   Medications Ordered in the ED - No data to display                                  Medical Decision Making Amount and/or Complexity of Data Reviewed Radiology: ordered.   This patient presents to the ED for concern of fall, this involves an extensive number of treatment options, and is a complaint that carries with it a high risk of complications and morbidity.  I considered the following differential and admission for this acute, potentially life threatening condition.  The differential diagnosis includes intracranial hemorrhage, spinal injury, coccyx fracture, other fracture, contusion, vertigo  MDM:    Patient is a 64 year old who presents after mechanical fall yesterday.  She mostly has pain in her coccyx.  She did hit her head but she had no loss conscious.  No ongoing headaches.  She had a little episode of vertigo  this morning although denies any ongoing dizziness.  No significant nystagmus on exam.  No neurologic deficits.  She had a head CT which does not show any intracranial hemorrhage or other acute abnormality.  CT scan of her cervical spine does not show any cervical spine bony injury.  There is an enlarged thyroid gland which will need an outpatient ultrasound.  Discussed these findings with the patient.  X-ray of her coccyx shows a probable fracture.  She was discharged home in good condition.  Symptomatic care instructions were given.  Was encouraged to make an appointment to have close follow-up with her PCP.  Return precautions were given.  (Labs, imaging, consults)  Labs: I Ordered, and personally interpreted labs.  The pertinent results include:     Imaging Studies ordered: I ordered imaging studies including CT head, cervical spine, coccyx x-ray I independently visualized and interpreted imaging. I agree with the radiologist  interpretation  Additional history obtained from  .  External records from outside source obtained and reviewed including prior notes  Cardiac Monitoring: The patient is not maintained on a cardiac monitor.  If on the cardiac monitor, I personally viewed and interpreted the cardiac monitored which showed an underlying rhythm of:    Reevaluation: After the interventions noted above, I reevaluated the patient and found that they have :improved  Social Determinants of Health:    Disposition: Discharged to home  Co morbidities that complicate the patient evaluation  Past Medical History:  Diagnosis Date   History of iron deficiency anemia    LGA (large for gestational age) infant    Screen FBS yearly   Obesity    Sleep apnea    With CPAP -Dr Shlomo     Medicines No orders of the defined types were placed in this encounter.   I have reviewed the patients home medicines and have made adjustments as needed  Problem List / ED Course: Problem List Items Addressed This Visit   None Visit Diagnoses       Closed fracture of sacrum and coccyx, initial encounter (HCC)    -  Primary     Minor head injury, initial encounter         Enlarged thyroid                    Final diagnoses:  Closed fracture of sacrum and coccyx, initial encounter (HCC)  Minor head injury, initial encounter  Enlarged thyroid    ED Discharge Orders     None          Lenor Hollering, MD 03/09/24 1237  "

## 2024-03-09 NOTE — ED Notes (Signed)

## 2024-03-09 NOTE — ED Notes (Signed)
 Patient transported to CT

## 2024-03-09 NOTE — Discharge Instructions (Addendum)
 Make an appointment to follow-up with your primary care doctor.  You can use a donut pillow for comfort.  Your thyroid was enlarged and is recommended that you have an ultrasound that can be arranged by your primary care doctor.  Return to the emergency room if you have any worsening symptoms.
